# Patient Record
Sex: Female | Born: 1965 | Hispanic: No | Marital: Married | Smoking: Never smoker
Health system: Southern US, Community
[De-identification: ages and names within clinical notes are randomized; demographics above are authoritative.]

## PROBLEM LIST (undated history)

## (undated) DIAGNOSIS — E785 Hyperlipidemia, unspecified: Secondary | ICD-10-CM

## (undated) HISTORY — DX: Hyperlipidemia, unspecified: E78.5

---

## 2013-09-22 ENCOUNTER — Emergency Department (HOSPITAL_BASED_OUTPATIENT_CLINIC_OR_DEPARTMENT_OTHER): Payer: No Typology Code available for payment source

## 2013-09-22 ENCOUNTER — Emergency Department (HOSPITAL_BASED_OUTPATIENT_CLINIC_OR_DEPARTMENT_OTHER)
Admission: EM | Admit: 2013-09-22 | Discharge: 2013-09-22 | Disposition: A | Discharge status: Home / Self Care | Payer: No Typology Code available for payment source | Attending: Emergency Medicine | Admitting: Emergency Medicine

## 2013-09-22 ENCOUNTER — Encounter (HOSPITAL_BASED_OUTPATIENT_CLINIC_OR_DEPARTMENT_OTHER): Payer: Self-pay | Admitting: Emergency Medicine

## 2013-09-22 DIAGNOSIS — L02511 Cutaneous abscess of right hand: Secondary | ICD-10-CM

## 2013-09-22 DIAGNOSIS — L02519 Cutaneous abscess of unspecified hand: Secondary | ICD-10-CM | POA: Insufficient documentation

## 2013-09-22 DIAGNOSIS — L03019 Cellulitis of unspecified finger: Secondary | ICD-10-CM | POA: Diagnosis present

## 2013-09-22 DIAGNOSIS — L03011 Cellulitis of right finger: Secondary | ICD-10-CM

## 2013-09-22 LAB — BASIC METABOLIC PANEL
ANION GAP: 11 (ref 5–15)
BUN: 14 mg/dL (ref 6–23)
CALCIUM: 9.2 mg/dL (ref 8.4–10.5)
CO2: 23 mEq/L (ref 19–32)
CREATININE: 0.5 mg/dL (ref 0.50–1.10)
Chloride: 104 mEq/L (ref 96–112)
GFR calc Af Amer: >90 mL/min (ref >90)
Glucose, Bld: 111 mg/dL — ABNORMAL HIGH (ref 70–99)
Potassium: 4.1 mEq/L (ref 3.7–5.3)
SODIUM: 138 meq/L (ref 137–147)

## 2013-09-22 LAB — CBC
HCT: 36.7 % (ref 36.0–46.0)
Hemoglobin: 12.4 g/dL (ref 12.0–15.0)
MCH: 29.4 pg (ref 26.0–34.0)
MCHC: 33.8 g/dL (ref 30.0–36.0)
MCV: 87 fL (ref 78.0–100.0)
Platelets: 201 10*3/uL (ref 150–400)
RBC: 4.22 MIL/uL (ref 3.87–5.11)
RDW: 12.3 % (ref 11.5–15.5)
WBC: 7.9 10*3/uL (ref 4.0–10.5)

## 2013-09-22 MED ORDER — OXYCODONE-ACETAMINOPHEN 5-325 MG PO TABS
1.0000 | ORAL_TABLET | Freq: Once | ORAL | Status: AC
  Administered 2013-09-22: 1 via ORAL
  Filled 2013-09-22: qty 1

## 2013-09-22 MED ORDER — OXYCODONE-ACETAMINOPHEN 5-325 MG PO TABS: 1.0000 | ORAL_TABLET | Freq: Four times a day (QID) | ORAL | Status: DC | PRN

## 2013-09-22 MED ORDER — CLINDAMYCIN HCL 300 MG PO CAPS: 300.0000 mg | ORAL_CAPSULE | Freq: Four times a day (QID) | ORAL | Status: DC

## 2013-09-22 MED ORDER — CLINDAMYCIN PHOSPHATE 600 MG/50ML IV SOLN
600.0000 mg | Freq: Once | INTRAVENOUS | Status: AC
  Administered 2013-09-22: 600 mg via INTRAVENOUS
  Filled 2013-09-22: qty 50

## 2013-09-22 NOTE — ED Provider Notes (Signed)
CSN: [id number]     Arrival date & time 09/22/13  1835 History   First MD Initiated Contact with Patient 09/22/13 2051     Chief Complaint  Patient presents with  . Cellulitis     (Consider location/radiation/quality/duration/timing/severity/associated sxs/prior Treatment) HPI Comments: Patient is a 48 year old female who presents to the emergency department complaining of pain and swelling to her right thumb x3 days. Patient reports she started to have pain on the top of her thumb, and slowly noticed that it started to swell. She reports there has been redness spreading down her thumb into her hand. Pain described as pulsating. She tried taking ibuprofen with minimal relief. Denies fever or chills, vomiting, headache. No known injury or trauma.  The history is provided by the patient and a relative.    History reviewed. No pertinent past medical history. History reviewed. No pertinent past surgical history. No family history on file. History  Substance Use Topics  . Smoking status: Never Smoker   . Smokeless tobacco: Not on file  . Alcohol Use: No   OB History   Grav Para Term Preterm Abortions TAB SAB Ect Mult Living                 Review of Systems  Musculoskeletal:       + right thumb pain and swelling.  Skin: Positive for color change.  All other systems reviewed and are negative.     Allergies  Review of patient's allergies indicates no known allergies.  Home Medications   Prior to Admission medications   Medication Sig Start Date End Date Taking? Authorizing Provider  clindamycin (CLEOCIN) 300 MG capsule Take 1 capsule (300 mg total) by mouth 4 (four) times daily. X 7 days 09/22/13   Trevor Maceobyn M Albert, PA-C  oxyCODONE-acetaminophen (PERCOCET) 5-325 MG per tablet Take 1-2 tablets by mouth every 6 (six) hours as needed for severe pain. 09/22/13   Trevor Maceobyn M Albert, PA-C   BP 121/91  Pulse 66  Temp(Src) 97.9 F (36.6 C) (Oral)  Resp 18  Ht 5\' 3"  (1.6 m)  Wt 130 lb  (58.968 kg)  BMI 23.03 kg/m2  SpO2 100% Physical Exam  Nursing note and vitals reviewed. Constitutional: She is oriented to person, place, and time. She appears well-developed and well-nourished. No distress.  HENT:  Head: Normocephalic and atraumatic.  Mouth/Throat: Oropharynx is clear and moist.  Eyes: Conjunctivae and EOM are normal.  Neck: Normal range of motion. Neck supple.  Cardiovascular: Normal rate, regular rhythm and normal heart sounds.   Pulmonary/Chest: Effort normal and breath sounds normal. No respiratory distress.  Musculoskeletal:       Hands: 1 cm area of fluctuance on the dorsal aspect of right thumb over the DIP joint. No drainage. Surrounding erythema, warmth and swelling in to the thenar eminence as shown in diagram. Full R thumb and hand ROM, pain noted in thumb.  Neurological: She is alert and oriented to person, place, and time. No sensory deficit.  Skin: Skin is warm and dry.  Cap refill < 3 seconds.  Psychiatric: She has a normal mood and affect. Her behavior is normal.    ED Course  Procedures (including critical care time) INCISION AND DRAINAGE Performed by: Johnnette GourdAlbert, Quintessa Simmerman Consent: Verbal consent obtained. Risks and benefits: risks, benefits and alternatives were discussed Type: abscess  Body area: right thumb  Anesthesia: local infiltration  Incision was made with a scalpel.  Local anesthetic: lidocaine 2% without epinephrine  Anesthetic total: 1 ml  Complexity: simple  Drainage: purulent  Drainage amount: small  Packing material: none  Patient tolerance: Patient tolerated the procedure well with no immediate complications.   Labs Review Labs Reviewed  BASIC METABOLIC PANEL - Abnormal; Notable for the following:    Glucose, Bld 111 (*)    All other components within normal limits  CBC    Imaging Review Dg Hand Complete Right  09/22/2013   CLINICAL DATA:  Cellulitis.  EXAM: RIGHT HAND - COMPLETE 3+ VIEW  COMPARISON:  None.   FINDINGS: Concerning the history of thumb cellulitis, there is no radiopaque foreign body, subcutaneous gas, or evidence of acute osteomyelitis.  The tufts and mid portions of the second through fifth distal phalanges are abnormally sclerotic. No bone erosion. No evidence of erosive arthritis  No acute fracture malalignment.  Next.  IMPRESSION: 1. No acute osseous findings.  No subcutaneous gas. 2. Acrosclerosis, sometimes seen with scleroderma or other connective tissue disease. Correlate with rheumatologic history.   Electronically Signed   By: Tiburcio Pea M.D.   On: 09/22/2013 22:11     EKG Interpretation None      MDM   Final diagnoses:  Cellulitis of right thumb  Abscess of right thumb   Pt presenting with abscess and cellulitis to right thumb. She is well appearing and in no apparent distress. Afebrile, vital signs stable. Skin markings were drawn in triage. She was given a Percocet in triage with relief of her symptoms. Given cellulitis into hand, will check labs, give a dose of IV clinda in the ED and d/c home with oral antibiotics. I do not feel admission is necessary at this time. Will drain abscess. 10:51 PM Abscess drained with small amount of purulent drainage. Antibiotics given. Erythema has not spread. No leukocytosis. Vitals remain stable. Skin markings drawn. Advised f/u with [facility] or ED in 2-3 days for recheck. Strict return precautions given. Will d/c on clinda and pain medication. Return precautions given. Patient and family state understanding of treatment care plan and are agreeable.  Trevor Mace, PA-C 09/22/13 2253

## 2013-09-22 NOTE — Discharge Instructions (Signed)
Take antibiotic to completion. Take percocet for severe pain only. No driving or operating heavy machinery while taking percocet. This medication may cause drowsiness. Return to the emergency department if the redness spreads beyond the skin markings that were drawn today. Return to an urgent care Center or the emergency department for reevaluation in 2-3 days.  Cellulitis Cellulitis is an infection of the skin and the tissue beneath it. The infected area is usually red and tender. Cellulitis occurs most often in the arms and lower legs.  CAUSES  Cellulitis is caused by bacteria that enter the skin through cracks or cuts in the skin. The most common types of bacteria that cause cellulitis are staphylococci and streptococci. SIGNS AND SYMPTOMS   Redness and warmth.  Swelling.  Tenderness or pain.  Fever. DIAGNOSIS  Your health care provider can usually determine what is wrong based on a physical exam. Blood tests may also be done. TREATMENT  Treatment usually involves taking an antibiotic medicine. HOME CARE INSTRUCTIONS   Take your antibiotic medicine as directed by your health care provider. Finish the antibiotic even if you start to feel better.  Keep the infected arm or leg elevated to reduce swelling.  Apply a warm cloth to the affected area up to 4 times per day to relieve pain.  Take medicines only as directed by your health care provider.  Keep all follow-up visits as directed by your health care provider. SEEK MEDICAL CARE IF:   You notice red streaks coming from the infected area.  Your red area gets larger or turns dark in color.  Your bone or joint underneath the infected area becomes painful after the skin has healed.  Your infection returns in the same area or another area.  You notice a swollen bump in the infected area.  You develop new symptoms.  You have a fever. SEEK IMMEDIATE MEDICAL CARE IF:   You feel very sleepy.  You develop vomiting or  diarrhea.  You have a general ill feeling (malaise) with muscle aches and pains. MAKE SURE YOU:   Understand these instructions.  Will watch your condition.  Will get help right away if you are not doing well or get worse. Document Released: 11/03/2004 Document Revised: 06/10/2013 Document Reviewed: 04/11/2011 [facility] Patient Information 2015 [place], [place]. This information is not intended to replace advice given to you by your health care provider. Make sure you discuss any questions you have with your health care provider.  Abscess An abscess is an infected area that contains a collection of pus and debris.It can occur in almost any part of the body. An abscess is also known as a furuncle or boil. CAUSES  An abscess occurs when tissue gets infected. This can occur from blockage of oil or sweat glands, infection of hair follicles, or a minor injury to the skin. As the body tries to fight the infection, pus collects in the area and creates pressure under the skin. This pressure causes pain. People with weakened immune systems have difficulty fighting infections and get certain abscesses more often.  SYMPTOMS Usually an abscess develops on the skin and becomes a painful mass that is red, warm, and tender. If the abscess forms under the skin, you may feel a moveable soft area under the skin. Some abscesses break open (rupture) on their own, but most will continue to get worse without care. The infection can spread deeper into the body and eventually into the bloodstream, causing you to feel ill.  DIAGNOSIS  Your caregiver will take your medical history and perform a physical exam. A sample of fluid may also be taken from the abscess to determine what is causing your infection. TREATMENT  Your caregiver may prescribe antibiotic medicines to fight the infection. However, taking antibiotics alone usually does not cure an abscess. Your caregiver may need to make a small cut (incision) in the  abscess to drain the pus. In some cases, gauze is packed into the abscess to reduce pain and to continue draining the area. HOME CARE INSTRUCTIONS   Only take over-the-counter or prescription medicines for pain, discomfort, or fever as directed by your caregiver.  If you were prescribed antibiotics, take them as directed. Finish them even if you start to feel better.  If gauze is used, follow your caregiver's directions for changing the gauze.  To avoid spreading the infection:  Keep your draining abscess covered with a bandage.  Wash your hands well.  Do not share personal care items, towels, or whirlpools with others.  Avoid skin contact with others.  Keep your skin and clothes clean around the abscess.  Keep all follow-up appointments as directed by your caregiver. SEEK MEDICAL CARE IF:   You have increased pain, swelling, redness, fluid drainage, or bleeding.  You have muscle aches, chills, or a general ill feeling.  You have a fever. MAKE SURE YOU:   Understand these instructions.  Will watch your condition.  Will get help right away if you are not doing well or get worse. Document Released: 11/03/2004 Document Revised: 07/26/2011 Document Reviewed: 04/08/2011 [facility] Patient Information 2015 [place], [place]. This information is not intended to replace advice given to you by your health care provider. Make sure you discuss any questions you have with your health care provider.  Abscess Care After An abscess (also called a boil or furuncle) is an infected area that contains a collection of pus. Signs and symptoms of an abscess include pain, tenderness, redness, or hardness, or you may feel a moveable soft area under your skin. An abscess can occur anywhere in the body. The infection may spread to surrounding tissues causing cellulitis. A cut (incision) by the surgeon was made over your abscess and the pus was drained out. Gauze may have been packed into the space to  provide a drain that will allow the cavity to heal from the inside outwards. The boil may be painful for 5 to 7 days. Most people with a boil do not have high fevers. Your abscess, if seen early, may not have localized, and may not have been lanced. If not, another appointment may be required for this if it does not get better on its own or with medications. HOME CARE INSTRUCTIONS   Only take over-the-counter or prescription medicines for pain, discomfort, or fever as directed by your caregiver.  When you bathe, soak and then remove gauze or iodoform packs at least daily or as directed by your caregiver. You may then wash the wound gently with mild soapy water. Repack with gauze or do as your caregiver directs. SEEK IMMEDIATE MEDICAL CARE IF:   You develop increased pain, swelling, redness, drainage, or bleeding in the wound site.  You develop signs of generalized infection including muscle aches, chills, fever, or a general ill feeling.  An oral temperature above 102 F (38.9 C) develops, not controlled by medication. See your caregiver for a recheck if you develop any of the symptoms described above. If medications (antibiotics) were prescribed, take them as directed. Document  Released: 08/12/2004 Document Revised: 04/18/2011 Document Reviewed: 04/09/2007 [facility] Patient Information 2015 [place]. This information is not intended to replace advice given to you by your health care provider. Make sure you discuss any questions you have with your health care provider.

## 2013-09-22 NOTE — ED Notes (Signed)
Patient having pain and swelling to right thumb and side of hand. Reddened and warm to touch.

## 2013-09-22 NOTE — ED Provider Notes (Signed)
Medical screening examination/treatment/procedure(s) were performed by non-physician practitioner and as supervising physician I was immediately available for consultation/collaboration.   EKG Interpretation None       Jenai Scaletta, MD 09/22/13 2359 

## 2013-10-23 DIAGNOSIS — R202 Paresthesia of skin: Secondary | ICD-10-CM | POA: Insufficient documentation

## 2017-04-26 ENCOUNTER — Encounter: Payer: Self-pay | Admitting: Physician Assistant

## 2017-04-26 ENCOUNTER — Ambulatory Visit (INDEPENDENT_AMBULATORY_CARE_PROVIDER_SITE_OTHER): Payer: No Typology Code available for payment source | Admitting: Physician Assistant

## 2017-04-26 ENCOUNTER — Other Ambulatory Visit: Payer: Self-pay

## 2017-04-26 VITALS — BP 144/80 | HR 68 | Temp 97.8°F | Resp 18 | Ht 63.0 in | Wt 139.8 lb

## 2017-04-26 DIAGNOSIS — K922 Gastrointestinal hemorrhage, unspecified: Secondary | ICD-10-CM | POA: Diagnosis not present

## 2017-04-26 DIAGNOSIS — Z1211 Encounter for screening for malignant neoplasm of colon: Secondary | ICD-10-CM | POA: Diagnosis not present

## 2017-04-26 DIAGNOSIS — R1013 Epigastric pain: Secondary | ICD-10-CM | POA: Diagnosis not present

## 2017-04-26 LAB — POCT CBC
Granulocyte percent: 61.2 %G (ref 37–80)
HCT, POC: 41.1 % (ref 37.7–47.9)
HEMOGLOBIN: 13.6 g/dL (ref 12.2–16.2)
LYMPH, POC: 2.1 (ref 0.6–3.4)
MCH, POC: 28.5 pg (ref 27–31.2)
MCHC: 33.1 g/dL (ref 31.8–35.4)
MCV: 86.1 fL (ref 80–97)
MID (cbc): 0.3 (ref 0–0.9)
MPV: 8.1 fL (ref 0–99.8)
POC Granulocyte: 3.7 (ref 2–6.9)
POC LYMPH PERCENT: 33.8 %L (ref 10–50)
POC MID %: 5 %M (ref 0–12)
Platelet Count, POC: 232 10*3/uL (ref 142–424)
RBC: 4.77 M/uL (ref 4.04–5.48)
RDW, POC: 12.9 %
WBC: 6.1 10*3/uL (ref 4.6–10.2)

## 2017-04-26 LAB — POCT URINALYSIS DIP (MANUAL ENTRY)
Bilirubin, UA: NEGATIVE
Blood, UA: NEGATIVE
Glucose, UA: NEGATIVE mg/dL
Ketones, POC UA: NEGATIVE mg/dL
Leukocytes, UA: NEGATIVE
Nitrite, UA: NEGATIVE
PH UA: 6 (ref 5.0–8.0)
Protein Ur, POC: NEGATIVE mg/dL
Spec Grav, UA: 1.02 (ref 1.010–1.025)
Urobilinogen, UA: 0.2 E.U./dL

## 2017-04-26 LAB — POC HEMOCCULT BLD/STL (OFFICE/1-CARD/DIAGNOSTIC): Fecal Occult Blood, POC: POSITIVE — AB

## 2017-04-26 MED ORDER — PANTOPRAZOLE SODIUM 40 MG PO TBEC: 40.0000 mg | DELAYED_RELEASE_TABLET | Freq: Every day | ORAL | 0 refills | Status: DC

## 2017-04-26 NOTE — Progress Notes (Signed)
04/26/2017 9:15 AM   DOB: 1965-10-09 / MRN: [id number]  SUBJECTIVE:  Stephanie Farrell is a 52 y.o. female presenting for abdominal pain.  Pain is been present now for about 15 days.  Patient states that sometimes eating makes it worse sometimes it does not.  She describes the pain as a cramping and burning sensation.  She does have her gallbladder and appendix and denies a history of surgery.  She is tried ibuprofen which has not helped.  She denies difficulty swallowing.  She denies blood in the stool.  She has never had a colonoscopy nor a colon screening.  She is willing to go for that today.  She is allergic to shellfish allergy.   She  has no past medical history on file.    She  reports that  has never smoked. she has never used smokeless tobacco. She reports that she does not drink alcohol. She  has no sexual activity history on file. The patient  has no past surgical history on file.  Her family history is not on file.  Review of Systems  Constitutional: Negative for chills and fever.  Respiratory: Negative for cough and shortness of breath.   Cardiovascular: Negative for chest pain.  Gastrointestinal: Positive for abdominal pain. Negative for blood in stool, constipation, diarrhea, heartburn, melena, nausea and vomiting.  Musculoskeletal: Negative for myalgias.  Skin: Negative for itching and rash.  Neurological: Negative for dizziness and headaches.    The problem list and medications were reviewed and updated by myself where necessary and exist elsewhere in the encounter.   OBJECTIVE:  BP (!) 144/80 (BP Location: Right Arm, Patient Position: Sitting, Cuff Size: Normal)   Pulse 68   Temp 97.8 F (36.6 C) (Oral)   Resp 18   Ht 5\' 3"  (1.6 m)   Wt 139 lb 12.8 oz (63.4 kg)   SpO2 98%   BMI 24.76 kg/m   Physical Exam  Constitutional: She is oriented to person, place, and time. She appears well-developed and well-nourished. She is active.  Non-toxic appearance. No distress.    Eyes: EOM are normal. Pupils are equal, round, and reactive to light.  Cardiovascular: Normal rate, regular rhythm, S1 normal, S2 normal, normal heart sounds and intact distal pulses. Exam reveals no gallop, no friction rub and no decreased pulses.  No murmur heard. Pulmonary/Chest: Effort normal. No stridor. No tachypnea. No respiratory distress. She has no wheezes. She has no rales. She exhibits no tenderness.  Abdominal: Soft. Normal appearance and bowel sounds are normal. She exhibits no distension and no mass. There is no tenderness. There is no rigidity, no rebound, no guarding and no CVA tenderness.  Genitourinary: Rectal exam shows guaiac positive stool.  Musculoskeletal: She exhibits no edema.  Neurological: She is alert and oriented to person, place, and time. She has normal strength and normal reflexes. She is not disoriented. She displays no atrophy. No cranial nerve deficit or sensory deficit. She exhibits normal muscle tone. Coordination and gait normal.  Skin: Skin is warm and dry. No rash noted. She is not diaphoretic. No cyanosis or erythema. No pallor.  She appears to be well perfused and without pallor.  Psychiatric: Her behavior is normal.    Results for orders placed or performed in visit on 04/26/17 (from the past 72 hour(s))  POC Hemoccult Bld/Stl (1-Cd Office Dx)     Status: Abnormal   Collection Time: 04/26/17  9:06 AM  Result Value Ref Range   Card #1 Date  04-26-17    Fecal Occult Blood, POC Positive (A) Negative  POCT urinalysis dipstick     Status: None   Collection Time: 04/26/17  9:10 AM  Result Value Ref Range   Color, UA yellow yellow   Clarity, UA clear clear   Glucose, UA negative negative mg/dL   Bilirubin, UA negative negative   Ketones, POC UA negative negative mg/dL   Spec Grav, UA [id number] - 1.025   Blood, UA negative negative   pH, UA 6.0 5.0 - 8.0   Protein Ur, POC negative negative mg/dL   Urobilinogen, UA 0.2 0.2 or 1.0 E.U./dL   Nitrite,  UA Negative Negative   Leukocytes, UA Negative Negative    No results found.  ASSESSMENT AND PLAN:  Stephanie Farrell was seen today for back pain.  Diagnoses and all orders for this visit:  Abdominal pain, epigastric: Patient here with epigastric pain times 15 days.  She is tried ibuprofen which has not really helped.  Eating sometimes makes it worse.  She is got a positive fecal occult blood with no visible blood on the smear.  This is most likely upper GI, however she is [age] and needs a colon screening.  I talked with her about this and she is willing to go.  I will screen her for H. pylori today and treat as necessary.  Hopefully she will take the pantoprazole exactly as prescribed. -     Cancel: Hemoccult - 1 Card (office) -     POCT CBC -     POCT urinalysis dipstick -     CMP and Liver -     H. pylori breath test -     POC Hemoccult Bld/Stl (1-Cd Office Dx) -     pantoprazole (PROTONIX) 40 MG tablet; Take 1 tablet (40 mg total) by mouth daily. Take thirty minutes before the first meal of the day.  Gastrointestinal hemorrhage, unspecified gastrointestinal hemorrhage type: See problem 1.  Screening for colon cancer -     Ambulatory referral to Gastroenterology    The patient is advised to call or return to clinic if she does not see an improvement in symptoms, or to seek the care of the closest emergency department if she worsens with the above plan.   Deliah BostonMichael Marina Boerner, MHS, PA-C [facility] 04/26/2017 9:15 AM

## 2017-04-26 NOTE — Patient Instructions (Signed)
Take the pantoprazole exactly as prescribed. Go for your colonoscopy.  Sign up for mychart so I can release your results and prescribe medications if I need to.

## 2017-04-27 LAB — CMP AND LIVER
ALT: 21 IU/L (ref 0–32)
AST: 24 IU/L (ref 0–40)
Albumin: 4.3 g/dL (ref 3.5–5.5)
Alkaline Phosphatase: 75 IU/L (ref 39–117)
BUN: 10 mg/dL (ref 6–24)
Bilirubin Total: 0.3 mg/dL (ref 0.0–1.2)
Bilirubin, Direct: 0.1 mg/dL (ref 0.00–0.40)
CALCIUM: 9.7 mg/dL (ref 8.7–10.2)
CO2: 25 mmol/L (ref 20–29)
Chloride: 103 mmol/L (ref 96–106)
Creatinine, Ser: 0.74 mg/dL (ref 0.57–1.00)
GFR calc non Af Amer: 93 mL/min/{1.73_m2} (ref >59)
GFR, EST AFRICAN AMERICAN: 107 mL/min/{1.73_m2} (ref >59)
Glucose: 98 mg/dL (ref 65–99)
Potassium: 4.2 mmol/L (ref 3.5–5.2)
SODIUM: 142 mmol/L (ref 134–144)
TOTAL PROTEIN: 7.3 g/dL (ref 6.0–8.5)

## 2017-04-27 LAB — H. PYLORI BREATH TEST: H pylori Breath Test: POSITIVE — AB

## 2017-04-28 ENCOUNTER — Other Ambulatory Visit: Payer: Self-pay | Admitting: Physician Assistant

## 2017-04-28 MED ORDER — CLARITHROMYCIN 500 MG PO TABS: 500.0000 mg | ORAL_TABLET | Freq: Two times a day (BID) | ORAL | 0 refills | Status: DC

## 2017-04-28 MED ORDER — AMOXICILLIN 500 MG PO CAPS
1000.0000 mg | ORAL_CAPSULE | Freq: Two times a day (BID) | ORAL | 0 refills | Status: DC
Start: 2017-04-28 — End: 2017-07-26

## 2017-04-28 NOTE — Progress Notes (Signed)
Patient made aware  via telephone call.  Starting amoxicillin and Biaxin.  14 days of therapy.

## 2017-04-28 NOTE — Progress Notes (Signed)
Patient made aware of positive H. pylori lab.  This explains her epigastric pain, positive fecal occult blood, as well as the chronicity of her symptoms.  I am treating with amoxicillin and Biaxin.  Of advised that she continue taking the pantoprazole as prescribed.  Stephanie BostonMichael Tylea Hise PA-C

## 2017-05-12 ENCOUNTER — Ambulatory Visit: Payer: Self-pay | Admitting: *Deleted

## 2017-05-12 NOTE — Telephone Encounter (Signed)
Pt c/o abd "tenderness with bloating and feeling like something is stuck in her stomach". She denies nausea and vomiting but decrease in appetite. She stated that she had been prescribed some medications on March 20th for bacteria in her stomach. She was taking amoxicillin, clarithromycin, and pantoprazole. She said that she quit taking these because she felt like these are causing her symptoms. This problem started 2 days ago. She is passing gas and burping. Pt advised that she needs to be seen today. Per protocol she needs an appointment within 4 hours, but she wants to wait until tomorrow.  Advise that if she starts feeling worst, increase in symptoms to call us back, pt voiced understanding. Reason for Disposition . [1] MILD-MODERATE pain AND [2] constant AND [3] present > 2 hours  Answer Assessment - Initial Assessment Questions 1. LOCATION: "Where does it hurt?"      Upper abd both sides 2. RADIATION: "Does the pain shoot anywhere else?" (e.g., chest, back)     Whole abd 3. ONSET: "When did the pain begin?" (e.g., minutes, hours or days ago)      2 days 4. SUDDEN: "Gradual or sudden onset?"     gradual 5. PATTERN "Does the pain come and go, or is it constant?"    - If constant: "Is it getting better, staying the same, or worsening?"      (Note: Constant means the pain never goes away completely; most serious pain is constant and it progresses)     - If intermittent: "How long does it last?" "Do you have pain now?"     (Note: Intermittent means the pain goes away completely between bouts)     constant 6. SEVERITY: "How bad is the pain?"  (e.g., Scale 1-10; mild, moderate, or severe)   - MILD (1-3): doesn't interfere with normal activities, abdomen soft and not tender to touch    - MODERATE (4-7): interferes with normal activities or awakens from sleep, tender to touch    - SEVERE (8-10): excruciating pain, doubled over, unable to do any normal activities      Pain # 5 to 6 7. RECURRENT  SYMPTOM: "Have you ever had this type of abdominal pain before?" If so, ask: "When was the last time?" and "What happened that time?"      no 8. CAUSE: "What do you think is causing the abdominal pain?"     From medications that she was prescribed on March 20th 9. RELIEVING/AGGRAVATING FACTORS: "What makes it better or worse?" (e.g., movement, antacids, bowel movement)     Have not tried anything to make it better or worst 10. OTHER SYMPTOMS: "Has there been any vomiting, diarrhea, constipation, or urine problems?"       no 11. PREGNANCY: "Is there any chance you are pregnant?" "When was your last menstrual period?"       No, no periods  Protocols used: [facility]

## 2017-05-13 ENCOUNTER — Ambulatory Visit: Payer: Self-pay | Admitting: Urgent Care

## 2017-06-05 ENCOUNTER — Encounter: Payer: Self-pay | Admitting: Physician Assistant

## 2017-07-26 ENCOUNTER — Encounter: Payer: Self-pay | Admitting: Physician Assistant

## 2017-07-26 ENCOUNTER — Ambulatory Visit (INDEPENDENT_AMBULATORY_CARE_PROVIDER_SITE_OTHER): Payer: No Typology Code available for payment source | Admitting: Physician Assistant

## 2017-07-26 ENCOUNTER — Other Ambulatory Visit: Payer: Self-pay

## 2017-07-26 VITALS — BP 104/62 | HR 75 | Temp 97.9°F | Ht 63.0 in | Wt 138.8 lb

## 2017-07-26 DIAGNOSIS — K2950 Unspecified chronic gastritis without bleeding: Secondary | ICD-10-CM

## 2017-07-26 DIAGNOSIS — R1012 Left upper quadrant pain: Secondary | ICD-10-CM

## 2017-07-26 DIAGNOSIS — B9681 Helicobacter pylori [H. pylori] as the cause of diseases classified elsewhere: Secondary | ICD-10-CM

## 2017-07-26 NOTE — Patient Instructions (Addendum)
  Please go for the gastroenterology appointment to get the colonoscopy.     IF you received an x-ray today, you will [facility] an [facility]. Please contact [facility] at [phone] with questions or concerns regarding your invoice.   IF you received labwork today, you will receive an invoice from [facility]. Please contact [facility] at [phone] with questions or concerns regarding your invoice.   Our billing staff will not be able to assist you with questions regarding bills from these companies.  You will be contacted with the lab results as soon as they are available. The fastest way to get your results is to activate your My Chart account. Instructions are located on the last page of this paperwork. If you have not heard from us regarding the results in 2 weeks, please contact this office.

## 2017-07-26 NOTE — Progress Notes (Signed)
Lab Results  Component Value Date   WBC 6.1 04/26/2017   HGB 13.6 04/26/2017   HCT 41.1 04/26/2017   MCV 86.1 04/26/2017   PLT 201 09/22/2013    Lab Results  Component Value Date   CREATININE 0.74 04/26/2017   BUN 10 04/26/2017   NA 142 04/26/2017   K 4.2 04/26/2017   CL 103 04/26/2017   CO2 25 04/26/2017    Lab Results  Component Value Date   ALT 21 04/26/2017   AST 24 04/26/2017   ALKPHOS 75 04/26/2017   BILITOT 0.3 04/26/2017    No results found for: TSH  No results found for: HGBA1C  No results found for: CHOL, HDL, LDLCALC, LDLDIRECT, TRIG, CHOLHDL

## 2017-07-26 NOTE — Progress Notes (Signed)
07/26/2017 9:24 AM   DOB: 09/27/1965 / MRN: [id number]  SUBJECTIVE:  Stephanie Farrell is a 52 y.o. female presenting for recheck abdominal pain.  Last I saw patient she had some blood in her stool along with epigastric symptoms.  Her H. pylori test did come back positive.  Given the blood in the stool and her age greater than 4350 I did refer her to GI however she failed to present for that appointment.  She did just get back from [place].  Her symptoms did improve after taking antibiotics.  She denies blood in the stool at this time, diarrhea, nausea, dysphagia, odynophagia, weight loss.  She tells me she will go for her colonoscopy given that she is no longer traveling.  She is allergic to shellfish allergy.   She  has no past medical history on file.    She  reports that she has never smoked. She has never used smokeless tobacco. She reports that she does not drink alcohol. She  has no sexual activity history on file. The patient  has no past surgical history on file.  Her family history is not on file.  ROS per HPI The problem list and medications were reviewed and updated by myself where necessary and exist elsewhere in the encounter.   OBJECTIVE:  BP 104/62 (BP Location: Right Arm, Patient Position: Sitting)   Pulse 75   Temp 97.9 F (36.6 C) (Oral)   Ht 5\' 3"  (1.6 m)   Wt 138 lb 12.8 oz (63 kg)   SpO2 98%   BMI 24.59 kg/m    Wt Readings from Last 3 Encounters:  07/26/17 138 lb 12.8 oz (63 kg)  04/26/17 139 lb 12.8 oz (63.4 kg)  09/22/13 130 lb (59 kg)    Physical Exam  Constitutional: She is oriented to person, place, and time. She appears well-nourished.  Non-toxic appearance. No distress.  Eyes: Pupils are equal, round, and reactive to light. EOM are normal.  Cardiovascular: Normal rate.  Pulmonary/Chest: Effort normal.  Abdominal: Soft. Normal appearance and bowel sounds are normal. She exhibits no distension and no mass. There is no tenderness. There is no rigidity, no  rebound, no guarding and no CVA tenderness.  Neurological: She is alert and oriented to person, place, and time. She has normal strength and normal reflexes. She is not disoriented. She displays no atrophy. No cranial nerve deficit or sensory deficit. She exhibits normal muscle tone. Coordination and gait normal.  Skin: Skin is warm and dry. She is not diaphoretic. No pallor.  Psychiatric: She has a normal mood and affect. Her behavior is normal.  Vitals reviewed.   No results found for this or any previous visit (from the past 72 hour(s)).  No results found.  ASSESSMENT AND PLAN:  Hassan RowanYuhong was seen today for follow-up.  Diagnoses and all orders for this visit:  Left upper quadrant pain: She did not complete antibiotic therapy.  But it is most likely that the bacteria was partially eradicated but has now returned and she probably has some resistance to amoxicillin and Biaxin.  I will have to treat with Levaquin if her test remains positive.  Will screen for other causes however this does not seem likely. -     Vitamin D, 25-hydroxy -     CMP and Liver -     TSH -     H. pylori breath test    The patient is advised to call or return to clinic if she does  not see an improvement in symptoms, or to seek the care of the closest emergency department if she worsens with the above plan.   Deliah Boston, MHS, PA-C [facility] at [facility] 07/26/2017 9:24 AM

## 2017-07-27 ENCOUNTER — Other Ambulatory Visit: Payer: Self-pay | Admitting: Physician Assistant

## 2017-07-27 DIAGNOSIS — R1013 Epigastric pain: Secondary | ICD-10-CM

## 2017-07-27 LAB — CMP AND LIVER
ALK PHOS: 79 IU/L (ref 39–117)
ALT: 18 IU/L (ref 0–32)
AST: 21 IU/L (ref 0–40)
Albumin: 4.5 g/dL (ref 3.5–5.5)
BILIRUBIN, DIRECT: 0.12 mg/dL (ref 0.00–0.40)
BUN: 10 mg/dL (ref 6–24)
Bilirubin Total: 0.4 mg/dL (ref 0.0–1.2)
CO2: 23 mmol/L (ref 20–29)
Calcium: 9.7 mg/dL (ref 8.7–10.2)
Chloride: 103 mmol/L (ref 96–106)
Creatinine, Ser: 0.77 mg/dL (ref 0.57–1.00)
GFR calc Af Amer: 102 mL/min/{1.73_m2} (ref >59)
GFR calc non Af Amer: 88 mL/min/{1.73_m2} (ref >59)
Glucose: 86 mg/dL (ref 65–99)
Potassium: 4.4 mmol/L (ref 3.5–5.2)
SODIUM: 140 mmol/L (ref 134–144)
Total Protein: 7.4 g/dL (ref 6.0–8.5)

## 2017-07-27 LAB — TSH: TSH: 1.28 u[IU]/mL (ref 0.450–4.500)

## 2017-07-27 LAB — VITAMIN D 25 HYDROXY (VIT D DEFICIENCY, FRACTURES): VIT D 25 HYDROXY: 26 ng/mL — AB (ref 30.0–100.0)

## 2017-07-27 LAB — H. PYLORI BREATH TEST: H PYLORI BREATH TEST: POSITIVE — AB

## 2017-07-27 MED ORDER — PANTOPRAZOLE SODIUM 40 MG PO TBEC: 40.0000 mg | DELAYED_RELEASE_TABLET | Freq: Every day | ORAL | 0 refills | Status: DC

## 2017-07-28 NOTE — Addendum Note (Signed)
Addended by: Ofilia NeasLARK, Joshu Furukawa L on: 07/28/2017 02:15 PM   Modules accepted: Orders

## 2017-08-02 ENCOUNTER — Encounter: Payer: Self-pay | Admitting: Gastroenterology

## 2017-10-04 ENCOUNTER — Ambulatory Visit: Payer: No Typology Code available for payment source | Admitting: Gastroenterology

## 2017-10-07 ENCOUNTER — Other Ambulatory Visit: Payer: Self-pay

## 2017-10-07 ENCOUNTER — Emergency Department (HOSPITAL_BASED_OUTPATIENT_CLINIC_OR_DEPARTMENT_OTHER)
Admission: EM | Admit: 2017-10-07 | Discharge: 2017-10-08 | Disposition: A | Discharge status: Home / Self Care | Payer: No Typology Code available for payment source | Attending: Emergency Medicine | Admitting: Emergency Medicine

## 2017-10-07 ENCOUNTER — Emergency Department (HOSPITAL_BASED_OUTPATIENT_CLINIC_OR_DEPARTMENT_OTHER): Payer: No Typology Code available for payment source

## 2017-10-07 ENCOUNTER — Encounter (HOSPITAL_BASED_OUTPATIENT_CLINIC_OR_DEPARTMENT_OTHER): Payer: Self-pay | Admitting: Emergency Medicine

## 2017-10-07 DIAGNOSIS — R200 Anesthesia of skin: Secondary | ICD-10-CM | POA: Insufficient documentation

## 2017-10-07 DIAGNOSIS — R202 Paresthesia of skin: Secondary | ICD-10-CM

## 2017-10-07 LAB — CBC WITH DIFFERENTIAL/PLATELET
Basophils Absolute: 0 10*3/uL (ref 0.0–0.1)
Basophils Relative: 0 %
EOS ABS: 0.1 10*3/uL (ref 0.0–0.7)
Eosinophils Relative: 1 %
HCT: 39 % (ref 36.0–46.0)
Hemoglobin: 13.2 g/dL (ref 12.0–15.0)
LYMPHS PCT: 46 %
Lymphs Abs: 3.1 10*3/uL (ref 0.7–4.0)
MCH: 28.8 pg (ref 26.0–34.0)
MCHC: 33.8 g/dL (ref 30.0–36.0)
MCV: 85.2 fL (ref 78.0–100.0)
MONO ABS: 0.5 10*3/uL (ref 0.1–1.0)
MONOS PCT: 7 %
Neutro Abs: 3.2 10*3/uL (ref 1.7–7.7)
Neutrophils Relative %: 46 %
PLATELETS: 229 10*3/uL (ref 150–400)
RBC: 4.58 MIL/uL (ref 3.87–5.11)
RDW: 12.6 % (ref 11.5–15.5)
WBC: 6.8 10*3/uL (ref 4.0–10.5)

## 2017-10-07 NOTE — ED Notes (Signed)
Patient transported to CT 

## 2017-10-07 NOTE — ED Triage Notes (Signed)
PT presents with c/o left sided numbness since yesterday. No slurred speech or  facial droop or arm drift noted. No weakness noted in arms or legs.

## 2017-10-07 NOTE — ED Provider Notes (Signed)
[facility] EMERGENCY DEPARTMENT Provider Note   CSN: [id number] Arrival date & time: 10/07/17  2013     History   Chief Complaint Chief Complaint  Patient presents with  . Numbness    HPI Stephanie Farrell is a 52 y.o. female.  The history is provided by the patient and a relative. A language interpreter was used Armond Hang(mandarin [phone]).  Neurologic Problem  The current episode started yesterday. The problem occurs constantly. The problem has been gradually worsening. Associated symptoms include headaches. Pertinent negatives include no chest pain, no abdominal pain and no shortness of breath. Nothing aggravates the symptoms. Nothing relieves the symptoms.   Patient reports onset of numbness over 24 hours ago.  She reports at times it is in her left arm, sometimes her left face, and sometimes on the right side.  She also reports right-sided headache.  No focal weakness.  No visual changes.  No new medications.  She reports she has had CT scans done previously while living in [place]. She does report some left-sided shoulder pain at times  PMH-none Denies h/o CVA Patient Active Problem List   Diagnosis Date Noted  . Paresthesia 10/23/2013    History reviewed. No pertinent surgical history.   OB History   None      Home Medications    Prior to Admission medications   Medication Sig Start Date End Date Taking? Authorizing Provider  pantoprazole (PROTONIX) 40 MG tablet Take 1 tablet (40 mg total) by mouth daily. Take thirty minutes before the first meal of the day. 07/27/17   Ofilia Neaslark, Michael L, PA-C    Family History No family history on file.  Social History Social History   Tobacco Use  . Smoking status: Never Smoker  . Smokeless tobacco: Never Used  Substance Use Topics  . Alcohol use: No  . Drug use: Not on file     Allergies   Shellfish allergy   Review of Systems Review of Systems  Constitutional: Negative for fever.  Eyes: Negative for visual  disturbance.  Respiratory: Negative for shortness of breath.   Cardiovascular: Negative for chest pain.  Gastrointestinal: Negative for abdominal pain and vomiting.  Musculoskeletal: Positive for arthralgias and back pain.  Neurological: Positive for numbness and headaches. Negative for dizziness, speech difficulty and weakness.  All other systems reviewed and are negative.    Physical Exam Updated Vital Signs BP (!) 127/94 (BP Location: Left Arm)   Pulse 64   Temp 98 F (36.7 C) (Oral)   Resp 16   Ht 1.626 m (5\' 4" )   Wt 62.6 kg   SpO2 100%   BMI 23.69 kg/m   Physical Exam CONSTITUTIONAL: Well developed/well nourished HEAD: Normocephalic/atraumatic EYES: EOMI/PERRL, no nystagmus, no visual field deficit  no ptosis ENMT: Mucous membranes moist NECK: supple no meningeal signs, no bruits CV: S1/S2 noted, no murmurs/rubs/gallops noted LUNGS: Lungs are clear to auscultation bilaterally, no apparent distress ABDOMEN: soft, nontender, no rebound or guarding GU:no cva tenderness NEURO:Awake/alert, face symmetric, no arm or leg drift is noted Equal 5/5 strength with shoulder abduction, elbow flex/extension, wrist flex/extension in upper extremities and equal hand grips bilaterally Equal 5/5 strength with hip flexion,knee flex/extension, foot dorsi/plantar flexion Cranial nerves 3/4/5/6/08/15/08/11/12 tested and intact Gait normal without ataxia No past pointing Sensation to light touch intact in all extremities EXTREMITIES: pulses normalx4, full ROM SKIN: warm, color normal PSYCH: no abnormalities of mood noted   ED Treatments / Results  Labs (all labs ordered are listed, but  only abnormal results are displayed) Labs Reviewed  BASIC METABOLIC PANEL - Abnormal; Notable for the following components:      Result Value   Glucose, Bld 104 (*)    All other components within normal limits  CBC WITH DIFFERENTIAL/PLATELET    EKG None  Radiology Ct Head Wo Contrast  Result  Date: 10/08/2017 CLINICAL DATA:  LEFT-sided numbness. EXAM: CT HEAD WITHOUT CONTRAST TECHNIQUE: Contiguous axial images were obtained from the base of the skull through the vertex without intravenous contrast. COMPARISON:  None. FINDINGS: BRAIN: No intraparenchymal hemorrhage, mass effect nor midline shift. Borderline parenchymal brain volume loss. No hydrocephalus. No acute large vascular territory infarcts. No abnormal extra-axial fluid collections. Basal cisterns are patent. VASCULAR: Trace calcific atherosclerosis. SKULL/SOFT TISSUES: No skull fracture. No significant soft tissue swelling. ORBITS/SINUSES: The included ocular globes and orbital contents are normal.Trace paranasal sinus mucosal thickening. Mastoid air cells are well aerated. OTHER: None. IMPRESSION: 1. No acute intracranial process. 2. Borderline parenchymal brain volume loss. Electronically Signed   By: Awilda Metro M.D.   On: 10/08/2017 00:00    Procedures Procedures (including critical care time)  Medications Ordered in ED Medications - No data to display   Initial Impression / Assessment and Plan / ED Course  I have reviewed the triage vital signs and the nursing notes.  Pertinent labs & imaging results that were available during my care of the patient were reviewed by me and considered in my medical decision making (see chart for details).    Patient presenting with numbness for over 24 hours Her symptoms are continued inconsistent, at times she reports that her left side and sometimes on her right side.  During my exam she had no sensory deficit in her face.  There is no focal motor weakness noted.  She ambulated without difficulty, normal coordination.  History was very difficult even with Mandarin interpreter.  Therefore elected to do CT head to screen for any masses or other potential acute neurologic conditions.  CT head was negative.  I discussed this at length with the patient via interpreter [id number].   We  discussed strict ER return precautions.  She was referred to outpatient neurology.  Answered all questions from patient and her family.  Will discharge home   Final Clinical Impressions(s) / ED Diagnoses   Final diagnoses:  Paresthesia    ED Discharge Orders    None       Zadie Rhine, MD 10/08/17 [phone]

## 2017-10-08 LAB — BASIC METABOLIC PANEL
ANION GAP: 10 (ref 5–15)
BUN: 13 mg/dL (ref 6–20)
CALCIUM: 9.3 mg/dL (ref 8.9–10.3)
CO2: 25 mmol/L (ref 22–32)
CREATININE: 0.61 mg/dL (ref 0.44–1.00)
Chloride: 106 mmol/L (ref 98–111)
GFR calc non Af Amer: >60 mL/min (ref >60)
Glucose, Bld: 104 mg/dL — ABNORMAL HIGH (ref 70–99)
Potassium: 4.1 mmol/L (ref 3.5–5.1)
Sodium: 141 mmol/L (ref 135–145)

## 2017-10-08 NOTE — Discharge Instructions (Signed)

## 2017-11-16 ENCOUNTER — Ambulatory Visit (INDEPENDENT_AMBULATORY_CARE_PROVIDER_SITE_OTHER): Payer: No Typology Code available for payment source | Admitting: Gastroenterology

## 2017-11-16 ENCOUNTER — Encounter: Payer: Self-pay | Admitting: Gastroenterology

## 2017-11-16 VITALS — BP 104/70 | HR 80 | Ht 62.0 in | Wt 138.1 lb

## 2017-11-16 DIAGNOSIS — K648 Other hemorrhoids: Secondary | ICD-10-CM

## 2017-11-16 DIAGNOSIS — R195 Other fecal abnormalities: Secondary | ICD-10-CM

## 2017-11-16 DIAGNOSIS — R1011 Right upper quadrant pain: Secondary | ICD-10-CM | POA: Diagnosis not present

## 2017-11-16 DIAGNOSIS — A048 Other specified bacterial intestinal infections: Secondary | ICD-10-CM | POA: Diagnosis not present

## 2017-11-16 DIAGNOSIS — K5909 Other constipation: Secondary | ICD-10-CM

## 2017-11-16 MED ORDER — METRONIDAZOLE 250 MG PO TABS: 250.0000 mg | ORAL_TABLET | Freq: Four times a day (QID) | ORAL | 0 refills | Status: DC

## 2017-11-16 MED ORDER — DOXYCYCLINE HYCLATE 100 MG PO CAPS: 100.0000 mg | ORAL_CAPSULE | Freq: Two times a day (BID) | ORAL | 0 refills | Status: DC

## 2017-11-16 MED ORDER — NA SULFATE-K SULFATE-MG SULF 17.5-3.13-1.6 GM/177ML PO SOLN: ORAL | 0 refills | Status: DC

## 2017-11-16 MED ORDER — OMEPRAZOLE 40 MG PO CPDR: 40.0000 mg | DELAYED_RELEASE_CAPSULE | Freq: Two times a day (BID) | ORAL | 0 refills | Status: DC

## 2017-11-16 MED ORDER — BISMUTH SUBSALICYLATE 262 MG PO CHEW: CHEWABLE_TABLET | ORAL | 0 refills | Status: DC

## 2017-11-16 NOTE — Patient Instructions (Addendum)
  We will send in the following prescriptions for you:   Bismuth 524mg  1 tablet four times dailyX 14 days Flagyl 250mg  1 tablet four times daily X 14 days Doxycycline 100mg  1 capsule Twice daily X 14 days Omeprazole 40mg  twice daily X 14 days   You have been scheduled for an endoscopy and colonoscopy. Please follow the written instructions given to you at your visit today. Please pick up your prep supplies at the pharmacy within the next 1-3 days. If you use inhalers (even only as needed), please bring them with you on the day of your procedure. Your physician has requested that you go to www.startemmi.com and enter the access code given to you at your visit today. This web site gives a general overview about your procedure. However, you should still follow specific instructions given to you by our office regarding your preparation for the procedure.   Thank you for choosing [facility]  Philbert Riser Gabriana Wilmott,MD
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
## 2017-12-12 ENCOUNTER — Encounter: Payer: No Typology Code available for payment source | Admitting: Gastroenterology

## 2018-07-15 ENCOUNTER — Other Ambulatory Visit: Payer: Self-pay | Admitting: Gastroenterology

## 2019-02-16 IMAGING — CT CT HEAD W/O CM
3 series · 15 of 47 positions shown, 18 images · non-contrast
Comparison: None.

CLINICAL DATA: LEFT-sided numbness.

EXAM:
CT HEAD WITHOUT CONTRAST
TECHNIQUE: Contiguous axial images were obtained from the base of the skull
through the vertex without intravenous contrast.

[Series 2: head wo · axial · 0.45mm/px · z∈[+642,+767]mm · 9 of 31 slices shown, 12 images]
[im 3/31  brain]
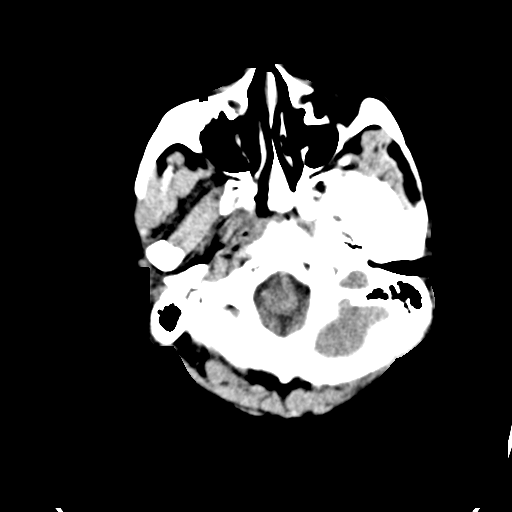
[im 3/31  bone]
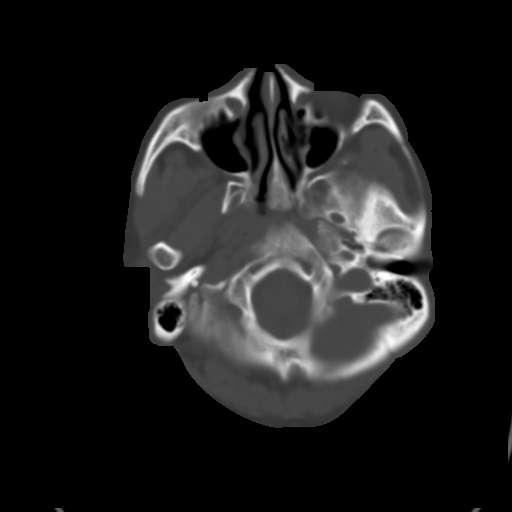
[im 6/31  brain]
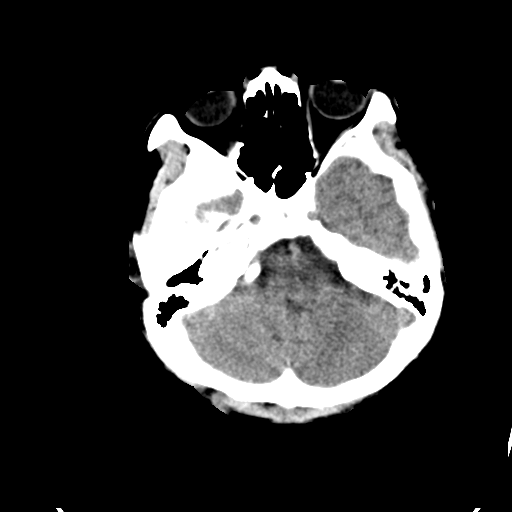
[im 9/31  brain]
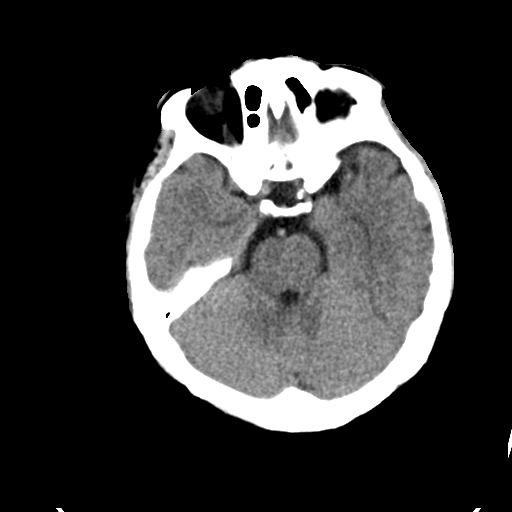
[im 12/31  brain]
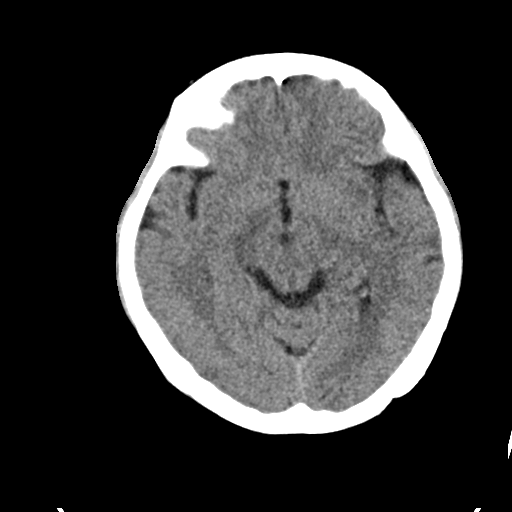
[im 16/31  brain]
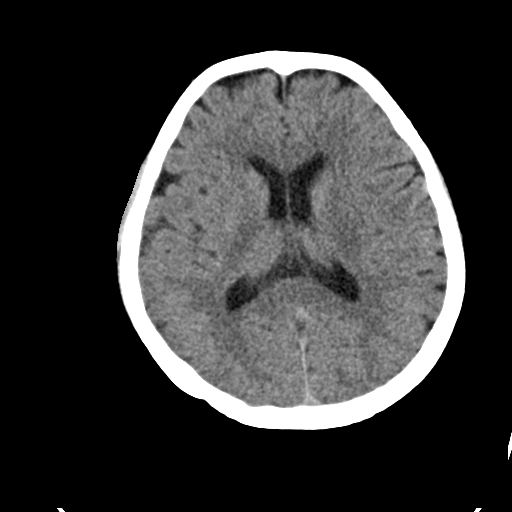
[im 16/31  bone]
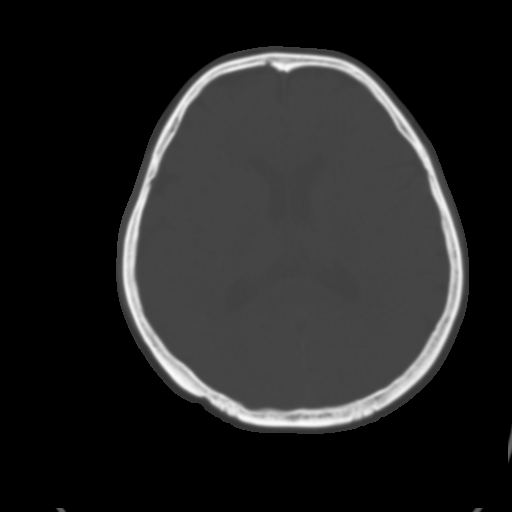
[im 19/31  brain]
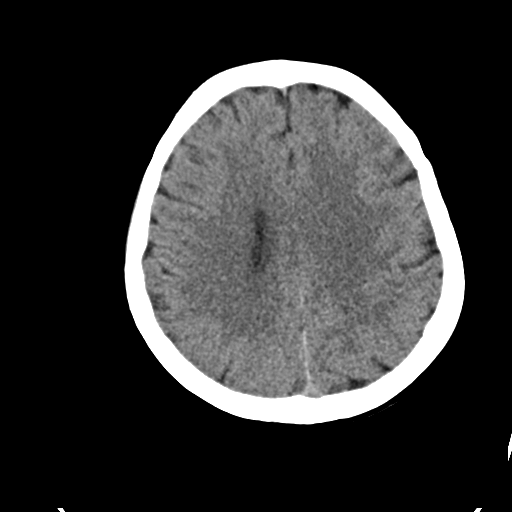
[im 22/31  brain]
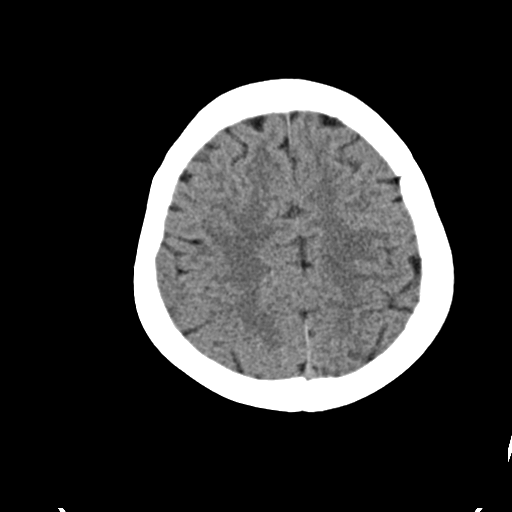
[im 25/31  brain]
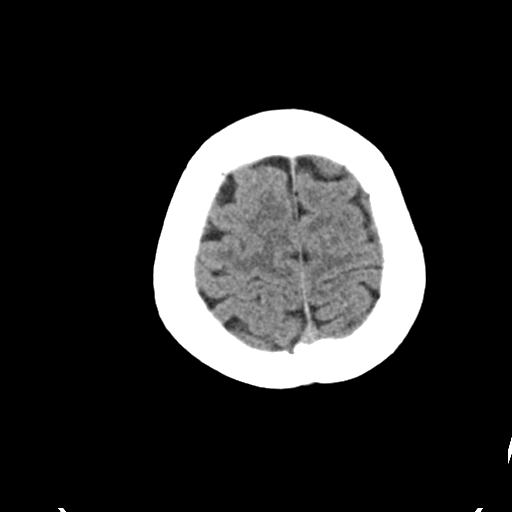
[im 28/31  brain]
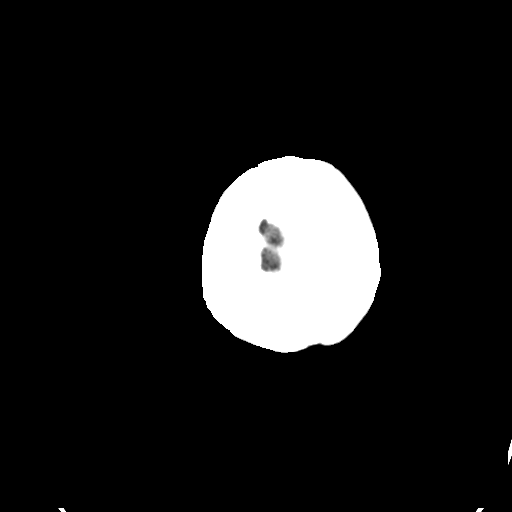
[im 28/31  bone]
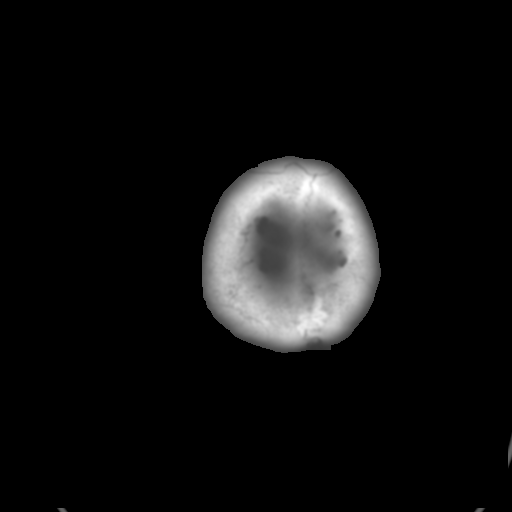

[Series 4: coronal soft · coronal · 0.33mm/px · 3 of 67 slices shown]
[im 23/67  brain]
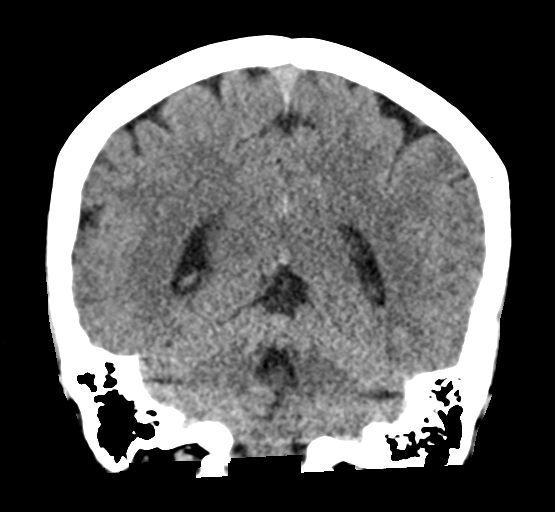
[im 30/67  brain]
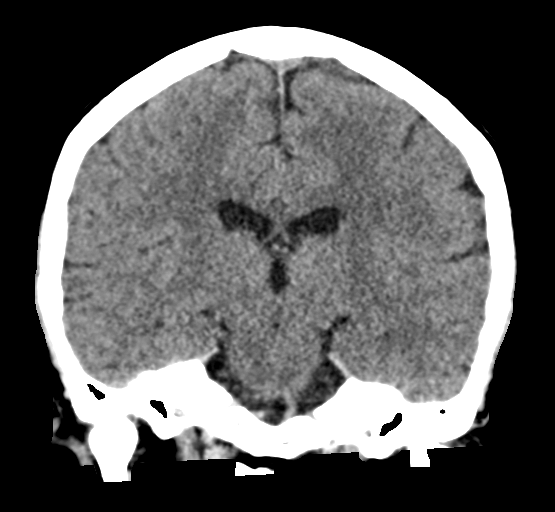
[im 37/67  brain]
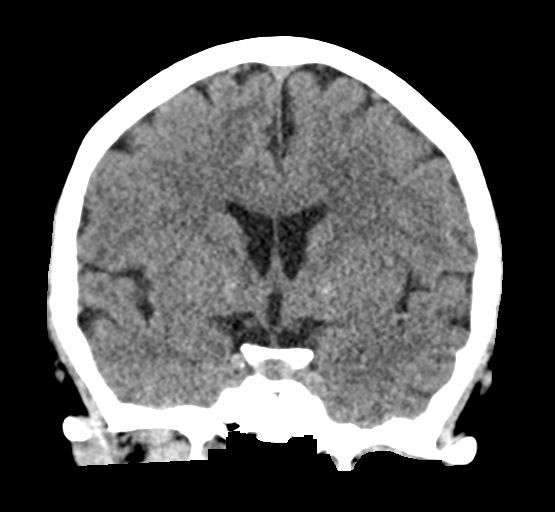

[Series 5: sag soft · sagittal · 0.30mm/px · 3 of 62 slices shown]
[im 21/62  brain]
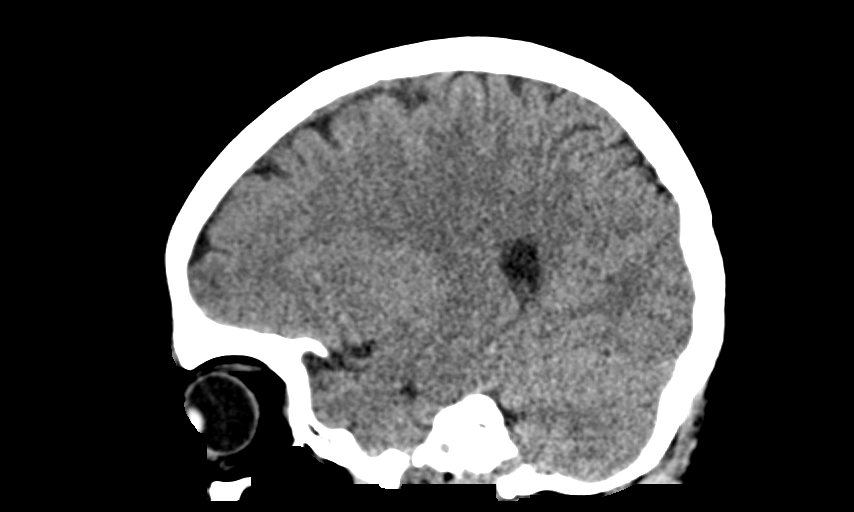
[im 31/62  brain]
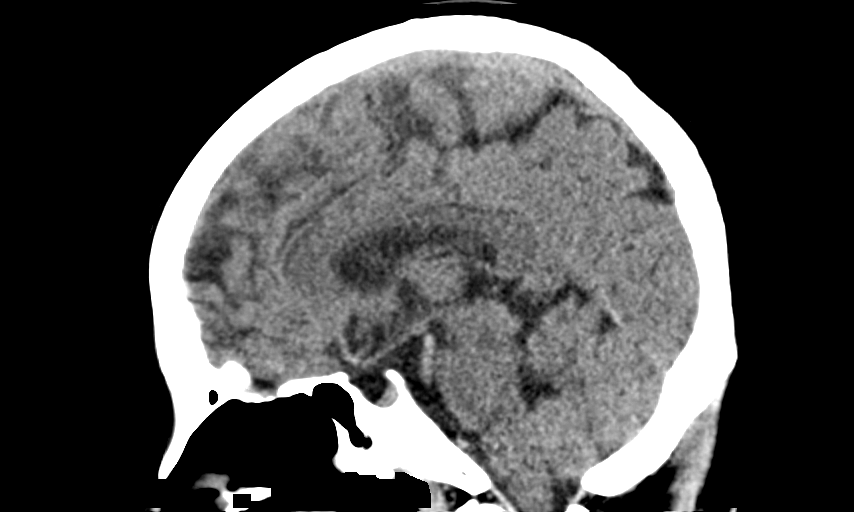
[im 41/62  brain]
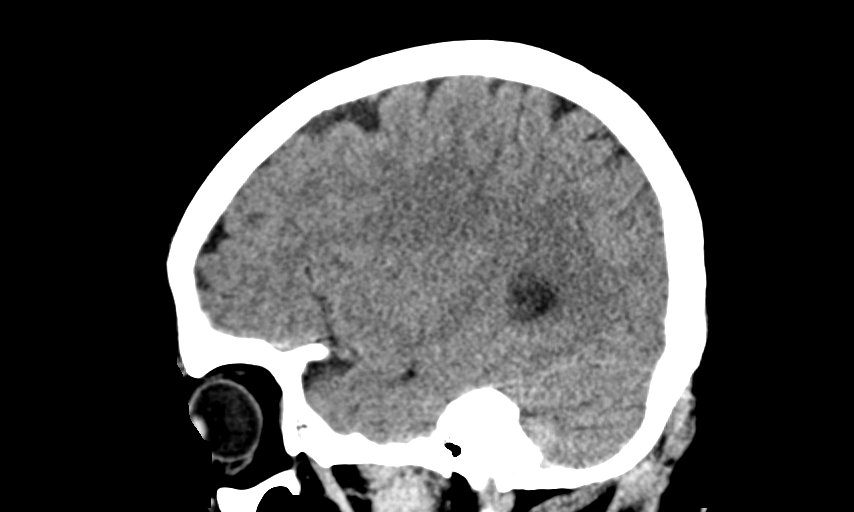

[15 of 47 positions shown; findings below may reference images not displayed]

FINDINGS: BRAIN: No intraparenchymal hemorrhage, mass effect nor midline
shift. Borderline parenchymal brain volume loss. No hydrocephalus.
No acute large vascular territory infarcts. No abnormal extra-axial
fluid collections. Basal cisterns are patent.

VASCULAR: Trace calcific atherosclerosis.

SKULL/SOFT TISSUES: No skull fracture. No significant soft tissue
swelling.

ORBITS/SINUSES: The included ocular globes and orbital contents are
normal.Trace paranasal sinus mucosal thickening. Mastoid air cells
are well aerated.

OTHER: None.
IMPRESSION: 1. No acute intracranial process.
2. Borderline parenchymal brain volume loss.

## 2019-08-08 ENCOUNTER — Other Ambulatory Visit (HOSPITAL_BASED_OUTPATIENT_CLINIC_OR_DEPARTMENT_OTHER): Payer: Self-pay | Admitting: Physician Assistant

## 2019-08-08 DIAGNOSIS — Z1231 Encounter for screening mammogram for malignant neoplasm of breast: Secondary | ICD-10-CM

## 2019-09-11 LAB — HM COLONOSCOPY

## 2020-06-24 ENCOUNTER — Other Ambulatory Visit: Payer: Self-pay

## 2020-06-24 ENCOUNTER — Ambulatory Visit (INDEPENDENT_AMBULATORY_CARE_PROVIDER_SITE_OTHER): Payer: 59 | Admitting: Internal Medicine

## 2020-06-24 ENCOUNTER — Encounter: Payer: Self-pay | Admitting: Internal Medicine

## 2020-06-24 VITALS — BP 116/76 | HR 73 | Temp 98.2°F | Ht 62.0 in | Wt 144.0 lb

## 2020-06-24 DIAGNOSIS — Z124 Encounter for screening for malignant neoplasm of cervix: Secondary | ICD-10-CM

## 2020-06-24 DIAGNOSIS — R202 Paresthesia of skin: Secondary | ICD-10-CM

## 2020-06-24 DIAGNOSIS — Z0001 Encounter for general adult medical examination with abnormal findings: Secondary | ICD-10-CM

## 2020-06-24 DIAGNOSIS — R200 Anesthesia of skin: Secondary | ICD-10-CM | POA: Insufficient documentation

## 2020-06-24 DIAGNOSIS — Z1231 Encounter for screening mammogram for malignant neoplasm of breast: Secondary | ICD-10-CM

## 2020-06-24 LAB — BASIC METABOLIC PANEL
BUN: 22 mg/dL (ref 6–23)
CO2: 25 mEq/L (ref 19–32)
Calcium: 9.2 mg/dL (ref 8.4–10.5)
Chloride: 103 mEq/L (ref 96–112)
Creatinine, Ser: 0.73 mg/dL (ref 0.40–1.20)
GFR: 91.97 mL/min (ref >60.00)
Glucose, Bld: 92 mg/dL (ref 70–99)
Potassium: 3.7 mEq/L (ref 3.5–5.1)
Sodium: 137 mEq/L (ref 135–145)

## 2020-06-24 LAB — CBC WITH DIFFERENTIAL/PLATELET
Basophils Absolute: 0 10*3/uL (ref 0.0–0.1)
Basophils Relative: 0.5 % (ref 0.0–3.0)
Eosinophils Absolute: 0 10*3/uL (ref 0.0–0.7)
Eosinophils Relative: 0.6 % (ref 0.0–5.0)
HCT: 39.7 % (ref 36.0–46.0)
Hemoglobin: 13.4 g/dL (ref 12.0–15.0)
Lymphocytes Relative: 29.1 % (ref 12.0–46.0)
Lymphs Abs: 2 10*3/uL (ref 0.7–4.0)
MCHC: 33.7 g/dL (ref 30.0–36.0)
MCV: 85.9 fl (ref 78.0–100.0)
Monocytes Absolute: 0.4 10*3/uL (ref 0.1–1.0)
Monocytes Relative: 6.5 % (ref 3.0–12.0)
Neutro Abs: 4.3 10*3/uL (ref 1.4–7.7)
Neutrophils Relative %: 63.3 % (ref 43.0–77.0)
Platelets: 238 10*3/uL (ref 150.0–400.0)
RBC: 4.62 Mil/uL (ref 3.87–5.11)
RDW: 12.7 % (ref 11.5–15.5)
WBC: 6.8 10*3/uL (ref 4.0–10.5)

## 2020-06-24 LAB — HEPATIC FUNCTION PANEL
ALT: 20 U/L (ref 0–35)
AST: 22 U/L (ref 0–37)
Albumin: 4.3 g/dL (ref 3.5–5.2)
Alkaline Phosphatase: 72 U/L (ref 39–117)
Bilirubin, Direct: 0 mg/dL (ref 0.0–0.3)
Total Bilirubin: 0.3 mg/dL (ref 0.2–1.2)
Total Protein: 7.4 g/dL (ref 6.0–8.3)

## 2020-06-24 LAB — HEMOGLOBIN A1C: Hgb A1c MFr Bld: 5.7 % (ref 4.6–6.5)

## 2020-06-24 LAB — LIPID PANEL
Cholesterol: 232 mg/dL — ABNORMAL HIGH (ref 0–200)
HDL: 66.2 mg/dL (ref >39.00)
NonHDL: 165.89
Total CHOL/HDL Ratio: 4
Triglycerides: 360 mg/dL — ABNORMAL HIGH (ref 0.0–149.0)
VLDL: 72 mg/dL — ABNORMAL HIGH (ref 0.0–40.0)

## 2020-06-24 LAB — LDL CHOLESTEROL, DIRECT: Direct LDL: 127 mg/dL

## 2020-06-24 LAB — FOLATE: Folate: >24.4 ng/mL (ref >5.9)

## 2020-06-24 LAB — VITAMIN B12: Vitamin B-12: 790 pg/mL (ref 211–911)

## 2020-06-24 NOTE — Progress Notes (Signed)
Subjective:  Patient ID: Stephanie Farrell, female    DOB: 06-27-1965  Age: 55 y.o. MRN: [id number]  CC: Annual Exam  This visit occurred during the SARS-CoV-2 public health emergency.  Safety protocols were in place, including screening questions prior to the visit, additional usage of staff PPE, and extensive cleaning of exam room while observing appropriate contact time as indicated for disinfecting solutions.   [place] female who speaks Mandarin.  Her daughter translates for her.  HPI Stephanie Farrell presents for a CPX, f/up, and to establish.  She complains of a several year history of numbness and tingling.  It is symmetrical and more prominent in the lower extremities than the upper extremities.  She also complains of pain in her large joints.  She has seen orthopedics and has had x-rays done.  She is getting symptom relief with an NSAID.  History Stephanie Farrell has a past medical history of Hyperlipidemia.   She has no past surgical history on file.   Her family history is not on file.She reports that she has never smoked. She has never used smokeless tobacco. She reports that she does not drink alcohol and does not use drugs.  Outpatient Medications Prior to Visit  Medication Sig Dispense Refill  . bismuth subsalicylate (PEPTO BISMOL) 262 MG chewable tablet 1 chew four times a day for 14 days 56 tablet 0  . doxycycline (VIBRAMYCIN) 100 MG capsule Take 1 capsule (100 mg total) by mouth 2 (two) times daily. X 14 days 28 capsule 0  . ibuprofen (ADVIL,MOTRIN) 100 MG tablet Take 100 mg by mouth as needed for fever.    . metroNIDAZOLE (FLAGYL) 250 MG tablet Take 1 tablet (250 mg total) by mouth 4 (four) times daily. X 14 days 56 tablet 0  . Na Sulfate-K Sulfate-Mg Sulf 17.5-3.13-1.6 GM/177ML SOLN 1 prep kit 354 mL 0  . omeprazole (PRILOSEC) 40 MG capsule Take 1 capsule (40 mg total) by mouth 2 (two) times daily. X 14 days 56 capsule 0   No facility-administered medications prior to visit.     ROS Review of Systems  Constitutional: Negative.  Negative for appetite change, diaphoresis, fatigue and unexpected weight change.  HENT: Negative.   Eyes: Negative for visual disturbance.  Respiratory: Negative.  Negative for cough, chest tightness and shortness of breath.   Cardiovascular: Negative for chest pain, palpitations and leg swelling.  Gastrointestinal: Negative for abdominal pain, constipation, diarrhea, nausea and vomiting.  Endocrine: Negative.   Genitourinary: Negative.  Negative for difficulty urinating.  Musculoskeletal: Positive for arthralgias and neck pain. Negative for back pain and myalgias.  Skin: Negative.  Negative for color change, pallor and rash.  Neurological: Positive for numbness. Negative for dizziness, seizures, speech difficulty, weakness, light-headedness and headaches.  Hematological: Negative for adenopathy. Does not bruise/bleed easily.  Psychiatric/Behavioral: Negative.     Objective:  BP 116/76 (BP Location: Right Arm, Patient Position: Sitting, Cuff Size: Large)   Pulse 73   Temp 98.2 F (36.8 C) (Oral)   Ht 5' 2" (1.575 m)   Wt 144 lb (65.3 kg)   SpO2 96%   BMI 26.34 kg/m   Physical Exam Vitals reviewed.  Constitutional:      Appearance: Normal appearance.  HENT:     Nose: Nose normal.     Mouth/Throat:     Mouth: Mucous membranes are moist.  Eyes:     General: No scleral icterus.    Conjunctiva/sclera: Conjunctivae normal.  Cardiovascular:     Rate and Rhythm: Normal  rate and regular rhythm.     Heart sounds: No murmur heard.   Pulmonary:     Effort: Pulmonary effort is normal.     Breath sounds: No stridor. No wheezing, rhonchi or rales.  Abdominal:     General: Abdomen is flat.     Palpations: There is no mass.     Tenderness: There is no abdominal tenderness. There is no guarding.     Hernia: No hernia is present.  Musculoskeletal:        General: Normal range of motion.     Cervical back: Neck supple.      Right lower leg: No edema.     Left lower leg: No edema.  Lymphadenopathy:     Cervical: No cervical adenopathy.  Skin:    General: Skin is warm and dry.  Neurological:     General: No focal deficit present.     Mental Status: She is alert and oriented to person, place, and time. Mental status is at baseline.     Cranial Nerves: Cranial nerves are intact.     Sensory: Sensation is intact.     Motor: Motor function is intact. No weakness.     Coordination: Coordination is intact.     Deep Tendon Reflexes: Reflexes normal. Babinski sign absent on the right side. Babinski sign absent on the left side.     Reflex Scores:      Tricep reflexes are 0 on the right side and 0 on the left side.      Bicep reflexes are 0 on the right side and 0 on the left side.      Brachioradialis reflexes are 0 on the right side and 0 on the left side.      Patellar reflexes are 0 on the right side and 0 on the left side.      Achilles reflexes are 0 on the right side and 0 on the left side. Psychiatric:        Mood and Affect: Mood normal.        Behavior: Behavior normal.     Lab Results  Component Value Date   WBC 6.8 06/24/2020   HGB 13.4 06/24/2020   HCT 39.7 06/24/2020   PLT 238.0 06/24/2020   GLUCOSE 92 06/24/2020   CHOL 232 (H) 06/24/2020   TRIG 360.0 (H) 06/24/2020   HDL 66.20 06/24/2020   LDLDIRECT 127.0 06/24/2020   ALT 20 06/24/2020   AST 22 06/24/2020   NA 137 06/24/2020   K 3.7 06/24/2020   CL 103 06/24/2020   CREATININE 0.73 06/24/2020   BUN 22 06/24/2020   CO2 25 06/24/2020   TSH 1.280 07/26/2017   HGBA1C 5.7 06/24/2020    Assessment & Plan:   Stephanie Farrell was seen today for annual exam.  Diagnoses and all orders for this visit:  Encounter for general adult medical examination with abnormal findings- Exam completed, labs reviewed, vaccines reviewed - She refused a Tdap, cancer screenings addressed, patient education was given. -     Lipid panel; Future -     HIV Antibody  (routine testing w rflx); Future -     Hepatitis C antibody; Future -     Hepatitis C antibody -     HIV Antibody (routine testing w rflx) -     Lipid panel  Bilateral numbness and tingling of arms and legs- Labs do not reveal any secondary causes.  I recommended that she see neurology as she may benefit from  undergoing an electrical study to evaluate for neuropthy. -     CBC with Differential/Platelet; Future -     Vitamin B12; Future -     Hemoglobin A1c; Future -     Hepatic function panel; Future -     Folate; Future -     Thyroid Panel With TSH; Future -     Basic metabolic panel; Future -     Vitamin B1; Future -     Vitamin B1 -     Basic metabolic panel -     Thyroid Panel With TSH -     Folate -     Hepatic function panel -     Hemoglobin A1c -     Vitamin B12 -     CBC with Differential/Platelet -     Ambulatory referral to Neurology  Visit for screening mammogram -     MM DIGITAL SCREENING BILATERAL; Future  Screening for cervical cancer -     Ambulatory referral to Gynecology  Other orders -     LDL cholesterol, direct   I have discontinued Stephanie Farrell's ibuprofen, Na Sulfate-K Sulfate-Mg Sulf, bismuth subsalicylate, metroNIDAZOLE, doxycycline, and omeprazole.  No orders of the defined types were placed in this encounter.    Follow-up: Return in about 3 months (around 09/24/2020).  Stephanie Calico, MD

## 2020-06-24 NOTE — Patient Instructions (Signed)
Health Maintenance, Female Adopting a healthy lifestyle and getting preventive care are important in promoting health and wellness. Ask your health care provider about:  The right schedule for you to have regular tests and exams.  Things you can do on your own to prevent diseases and keep yourself healthy. What should I know about diet, weight, and exercise? Eat a healthy diet  Eat a diet that includes plenty of vegetables, fruits, low-fat dairy products, and lean protein.  Do not eat a lot of foods that are high in solid fats, added sugars, or sodium.   Maintain a healthy weight Body mass index (BMI) is used to identify weight problems. It estimates body fat based on height and weight. Your health care provider can help determine your BMI and help you achieve or maintain a healthy weight. Get regular exercise Get regular exercise. This is one of the most important things you can do for your health. Most adults should:  Exercise for at least 150 minutes each week. The exercise should increase your heart rate and make you sweat (moderate-intensity exercise).  Do strengthening exercises at least twice a week. This is in addition to the moderate-intensity exercise.  Spend less time sitting. Even light physical activity can be beneficial. Watch cholesterol and blood lipids Have your blood tested for lipids and cholesterol at 56 years of age, then have this test every 5 years. Have your cholesterol levels checked more often if:  Your lipid or cholesterol levels are high.  You are older than 56 years of age.  You are at high risk for heart disease. What should I know about cancer screening? Depending on your health history and family history, you may need to have cancer screening at various ages. This may include screening for:  Breast cancer.  Cervical cancer.  Colorectal cancer.  Skin cancer.  Lung cancer. What should I know about heart disease, diabetes, and high blood  pressure? Blood pressure and heart disease  High blood pressure causes heart disease and increases the risk of stroke. This is more likely to develop in people who have high blood pressure readings, are of African descent, or are overweight.  Have your blood pressure checked: ? Every 3-5 years if you are 18-39 years of age. ? Every year if you are 40 years old or older. Diabetes Have regular diabetes screenings. This checks your fasting blood sugar level. Have the screening done:  Once every three years after age 40 if you are at a normal weight and have a low risk for diabetes.  More often and at a younger age if you are overweight or have a high risk for diabetes. What should I know about preventing infection? Hepatitis B If you have a higher risk for hepatitis B, you should be screened for this virus. Talk with your health care provider to find out if you are at risk for hepatitis B infection. Hepatitis C Testing is recommended for:  Everyone born from 1945 through 1965.  Anyone with known risk factors for hepatitis C. Sexually transmitted infections (STIs)  Get screened for STIs, including gonorrhea and chlamydia, if: ? You are sexually active and are younger than 56 years of age. ? You are older than 56 years of age and your health care provider tells you that you are at risk for this type of infection. ? Your sexual activity has changed since you were last screened, and you are at increased risk for chlamydia or gonorrhea. Ask your health care provider   if you are at risk.  Ask your health care provider about whether you are at high risk for HIV. Your health care provider may recommend a prescription medicine to help prevent HIV infection. If you choose to take medicine to prevent HIV, you should first get tested for HIV. You should then be tested every 3 months for as long as you are taking the medicine. Pregnancy  If you are about to stop having your period (premenopausal) and  you may become pregnant, seek counseling before you get pregnant.  Take 400 to 800 micrograms (mcg) of folic acid every day if you become pregnant.  Ask for birth control (contraception) if you want to prevent pregnancy. Osteoporosis and menopause Osteoporosis is a disease in which the bones lose minerals and strength with aging. This can result in bone fractures. If you are 65 years old or older, or if you are at risk for osteoporosis and fractures, ask your health care provider if you should:  Be screened for bone loss.  Take a calcium or vitamin D supplement to lower your risk of fractures.  Be given hormone replacement therapy (HRT) to treat symptoms of menopause. Follow these instructions at home: Lifestyle  Do not use any products that contain nicotine or tobacco, such as cigarettes, e-cigarettes, and chewing tobacco. If you need help quitting, ask your health care provider.  Do not use street drugs.  Do not share needles.  Ask your health care provider for help if you need support or information about quitting drugs. Alcohol use  Do not drink alcohol if: ? Your health care provider tells you not to drink. ? You are pregnant, may be pregnant, or are planning to become pregnant.  If you drink alcohol: ? Limit how much you use to 0-1 drink a day. ? Limit intake if you are breastfeeding.  Be aware of how much alcohol is in your drink. In the [place] one drink equals one 12 oz bottle of beer (355 mL), one 5 oz glass of wine (148 mL), or one 1 oz glass of hard liquor (44 mL). General instructions  Schedule regular health, dental, and eye exams.  Stay current with your vaccines.  Tell your health care provider if: ? You often feel depressed. ? You have ever been abused or do not feel safe at home. Summary  Adopting a healthy lifestyle and getting preventive care are important in promoting health and wellness.  Follow your health care provider's instructions about healthy  diet, exercising, and getting tested or screened for diseases.  Follow your health care provider's instructions on monitoring your cholesterol and blood pressure. This information is not intended to replace advice given to you by your health care provider. Make sure you discuss any questions you have with your health care provider. Document Revised: 01/17/2018 Document Reviewed: 01/17/2018 Elsevier Patient Education  2021 [employer] Inc[employer]  

## 2020-06-25 DIAGNOSIS — Z124 Encounter for screening for malignant neoplasm of cervix: Secondary | ICD-10-CM | POA: Insufficient documentation

## 2020-07-01 LAB — VITAMIN B1: Vitamin B1 (Thiamine): 19 nmol/L (ref 8–30)

## 2020-07-01 LAB — THYROID PANEL WITH TSH
Free Thyroxine Index: 2.4 (ref 1.4–3.8)
T3 Uptake: 32 % (ref 22–35)
T4, Total: 7.4 ug/dL (ref 5.1–11.9)
TSH: 0.68 mIU/L (ref 0.40–4.50)

## 2020-07-01 LAB — HEPATITIS C ANTIBODY
Hepatitis C Ab: NONREACTIVE
SIGNAL TO CUT-OFF: 0 (ref <1.00)

## 2020-07-01 LAB — HIV ANTIBODY (ROUTINE TESTING W REFLEX): HIV 1&2 Ab, 4th Generation: NONREACTIVE

## 2020-12-28 ENCOUNTER — Encounter: Payer: Self-pay | Admitting: Internal Medicine

## 2020-12-28 ENCOUNTER — Other Ambulatory Visit: Payer: Self-pay

## 2020-12-28 ENCOUNTER — Ambulatory Visit (INDEPENDENT_AMBULATORY_CARE_PROVIDER_SITE_OTHER): Payer: 59 | Admitting: Internal Medicine

## 2020-12-28 VITALS — BP 121/80 | HR 61 | Temp 98.2°F | Wt 137.2 lb

## 2020-12-28 DIAGNOSIS — E781 Pure hyperglyceridemia: Secondary | ICD-10-CM

## 2020-12-28 DIAGNOSIS — Z1231 Encounter for screening mammogram for malignant neoplasm of breast: Secondary | ICD-10-CM

## 2020-12-28 DIAGNOSIS — R10822 Left upper quadrant rebound abdominal tenderness: Secondary | ICD-10-CM

## 2020-12-28 LAB — URINALYSIS, ROUTINE W REFLEX MICROSCOPIC
Bilirubin Urine: NEGATIVE
Hgb urine dipstick: NEGATIVE
Ketones, ur: NEGATIVE
Leukocytes,Ua: NEGATIVE
Nitrite: NEGATIVE
RBC / HPF: NONE SEEN (ref 0–?)
Specific Gravity, Urine: 1.015 (ref 1.000–1.030)
Total Protein, Urine: NEGATIVE
Urine Glucose: NEGATIVE
Urobilinogen, UA: 0.2 (ref 0.0–1.0)
WBC, UA: NONE SEEN (ref 0–?)
pH: 7.5 (ref 5.0–8.0)

## 2020-12-28 LAB — BASIC METABOLIC PANEL
BUN: 14 mg/dL (ref 6–23)
CO2: 32 mEq/L (ref 19–32)
Calcium: 9.4 mg/dL (ref 8.4–10.5)
Chloride: 102 mEq/L (ref 96–112)
Creatinine, Ser: 0.8 mg/dL (ref 0.40–1.20)
GFR: 82.1 mL/min (ref >60.00)
Glucose, Bld: 95 mg/dL (ref 70–99)
Potassium: 4.2 mEq/L (ref 3.5–5.1)
Sodium: 138 mEq/L (ref 135–145)

## 2020-12-28 LAB — CBC WITH DIFFERENTIAL/PLATELET
Basophils Absolute: 0 10*3/uL (ref 0.0–0.1)
Basophils Relative: 0.3 % (ref 0.0–3.0)
Eosinophils Absolute: 0 10*3/uL (ref 0.0–0.7)
Eosinophils Relative: 0.6 % (ref 0.0–5.0)
HCT: 39.9 % (ref 36.0–46.0)
Hemoglobin: 13.3 g/dL (ref 12.0–15.0)
Lymphocytes Relative: 32.4 % (ref 12.0–46.0)
Lymphs Abs: 1.8 10*3/uL (ref 0.7–4.0)
MCHC: 33.3 g/dL (ref 30.0–36.0)
MCV: 86.4 fl (ref 78.0–100.0)
Monocytes Absolute: 0.3 10*3/uL (ref 0.1–1.0)
Monocytes Relative: 5.9 % (ref 3.0–12.0)
Neutro Abs: 3.3 10*3/uL (ref 1.4–7.7)
Neutrophils Relative %: 60.8 % (ref 43.0–77.0)
Platelets: 223 10*3/uL (ref 150.0–400.0)
RBC: 4.61 Mil/uL (ref 3.87–5.11)
RDW: 13.3 % (ref 11.5–15.5)
WBC: 5.4 10*3/uL (ref 4.0–10.5)

## 2020-12-28 LAB — LIPASE: Lipase: 29 U/L (ref 11.0–59.0)

## 2020-12-28 LAB — HEPATIC FUNCTION PANEL
ALT: 13 U/L (ref 0–35)
AST: 21 U/L (ref 0–37)
Albumin: 4.2 g/dL (ref 3.5–5.2)
Alkaline Phosphatase: 64 U/L (ref 39–117)
Bilirubin, Direct: 0.1 mg/dL (ref 0.0–0.3)
Total Bilirubin: 0.1 mg/dL — ABNORMAL LOW (ref 0.2–1.2)
Total Protein: 7.4 g/dL (ref 6.0–8.3)

## 2020-12-28 LAB — AMYLASE: Amylase: 40 U/L (ref 27–131)

## 2020-12-28 NOTE — Patient Instructions (Signed)

## 2020-12-28 NOTE — Progress Notes (Signed)
Subjective:  Patient ID: Stephanie Farrell, female    DOB: 07/29/1965  Age: 55 y.o. MRN: [id number]  CC: Abdominal Pain  This visit occurred during the SARS-CoV-2 public health emergency.  Safety protocols were in place, including screening questions prior to the visit, additional usage of staff PPE, and extensive cleaning of exam room while observing appropriate contact time as indicated for disinfecting solutions.    HPI Stephanie Farrell presents for f/up -  Her D translates for her. She complains of a one year hx of pain in the left anterior flank area. She describes it as an ache. She takes motrin for the pain. She refused a flu vax today.  No outpatient medications prior to visit.   No facility-administered medications prior to visit.    ROS Review of Systems  Constitutional:  Negative for diaphoresis, fatigue and unexpected weight change.  HENT: Negative.  Negative for trouble swallowing.   Eyes: Negative.   Respiratory:  Negative for cough, chest tightness, shortness of breath and wheezing.   Cardiovascular:  Negative for chest pain, palpitations and leg swelling.  Gastrointestinal:  Positive for abdominal pain. Negative for constipation, diarrhea, nausea and vomiting.  Endocrine: Negative.   Genitourinary:  Positive for flank pain. Negative for decreased urine volume, difficulty urinating, dysuria, hematuria and urgency.  Musculoskeletal:  Negative for back pain and myalgias.  Skin: Negative.   Neurological: Negative.  Negative for dizziness and weakness.  Hematological: Negative.  Negative for adenopathy. Does not bruise/bleed easily.  Psychiatric/Behavioral: Negative.     Objective:  BP 121/80 (BP Location: Left Arm)   Pulse 61   Temp 98.2 F (36.8 C) (Oral)   Wt 137 lb 3.2 oz (62.2 kg)   SpO2 97%   BMI 25.09 kg/m   BP Readings from Last 3 Encounters:  12/28/20 121/80  06/24/20 116/76  11/16/17 104/70    Wt Readings from Last 3 Encounters:  12/28/20 137 lb 3.2 oz  (62.2 kg)  06/24/20 144 lb (65.3 kg)  11/16/17 138 lb 2 oz (62.7 kg)    Physical Exam Vitals reviewed.  Constitutional:      Appearance: She is not ill-appearing.  HENT:     Nose: Nose normal.     Mouth/Throat:     Mouth: Mucous membranes are moist.  Eyes:     Conjunctiva/sclera: Conjunctivae normal.  Cardiovascular:     Rate and Rhythm: Normal rate and regular rhythm.     Heart sounds: No murmur heard. Pulmonary:     Effort: Pulmonary effort is normal.     Breath sounds: No stridor. No wheezing, rhonchi or rales.  Abdominal:     General: Abdomen is flat. Bowel sounds are normal. There is no distension.     Palpations: Abdomen is soft. There is no hepatomegaly, splenomegaly or mass.     Tenderness: There is abdominal tenderness in the left upper quadrant. There is no right CVA tenderness, left CVA tenderness or guarding.     Hernia: No hernia is present.  Musculoskeletal:        General: Normal range of motion.     Cervical back: Neck supple.     Right lower leg: No edema.     Left lower leg: No edema.  Lymphadenopathy:     Cervical: No cervical adenopathy.  Skin:    General: Skin is warm and dry.     Coloration: Skin is not pale.  Neurological:     General: No focal deficit present.     Mental  Status: She is alert.  Psychiatric:        Mood and Affect: Mood normal.        Behavior: Behavior normal.    Lab Results  Component Value Date   WBC 5.4 12/28/2020   HGB 13.3 12/28/2020   HCT 39.9 12/28/2020   PLT 223.0 12/28/2020   GLUCOSE 95 12/28/2020   CHOL 222 (H) 12/30/2020   TRIG 107.0 12/30/2020   HDL 72.60 12/30/2020   LDLDIRECT 127.0 06/24/2020   LDLCALC 128 (H) 12/30/2020   ALT 13 12/28/2020   AST 21 12/28/2020   NA 138 12/28/2020   K 4.2 12/28/2020   CL 102 12/28/2020   CREATININE 0.80 12/28/2020   BUN 14 12/28/2020   CO2 32 12/28/2020   TSH 0.68 06/24/2020   HGBA1C 5.7 06/24/2020    CT Head Wo Contrast  Result Date: 10/08/2017 CLINICAL DATA:   LEFT-sided numbness. EXAM: CT HEAD WITHOUT CONTRAST TECHNIQUE: Contiguous axial images were obtained from the base of the skull through the vertex without intravenous contrast. COMPARISON:  None. FINDINGS: BRAIN: No intraparenchymal hemorrhage, mass effect nor midline shift. Borderline parenchymal brain volume loss. No hydrocephalus. No acute large vascular territory infarcts. No abnormal extra-axial fluid collections. Basal cisterns are patent. VASCULAR: Trace calcific atherosclerosis. SKULL/SOFT TISSUES: No skull fracture. No significant soft tissue swelling. ORBITS/SINUSES: The included ocular globes and orbital contents are normal.Trace paranasal sinus mucosal thickening. Mastoid air cells are well aerated. OTHER: None. IMPRESSION: 1. No acute intracranial process. 2. Borderline parenchymal brain volume loss. Electronically Signed   By: Awilda Metro M.D.   On: 10/08/2017 00:00    Assessment & Plan:   Stephanie Farrell was seen today for abdominal pain.  Diagnoses and all orders for this visit:  Visit for screening mammogram -     MM DIGITAL SCREENING BILATERAL; Future  Left upper quadrant abdominal tenderness with rebound tenderness- Based a paucity of other symptoms, her exam , and normal labs I favor a benign cause for the pain. Will treat/diagnose further if indicated. -     CBC with Differential/Platelet; Future -     Basic metabolic panel; Future -     Lipase; Future -     Amylase; Future -     Urinalysis, Routine w reflex microscopic; Future -     Hepatic function panel; Future -     Hepatic function panel -     Urinalysis, Routine w reflex microscopic -     Amylase -     Lipase -     Basic metabolic panel -     CBC with Differential/Platelet  Pure hyperglyceridemia- Her trigs are normal now, -     Lipid panel; Future  Stephanie Farrell does not currently have medications on file.  No orders of the defined types were placed in this encounter.    Follow-up: Return in about 3 weeks  (around 01/18/2021).  Sanda Linger, MD

## 2020-12-29 ENCOUNTER — Other Ambulatory Visit: Payer: Self-pay | Admitting: Internal Medicine

## 2020-12-29 DIAGNOSIS — E781 Pure hyperglyceridemia: Secondary | ICD-10-CM | POA: Insufficient documentation

## 2020-12-30 ENCOUNTER — Other Ambulatory Visit (INDEPENDENT_AMBULATORY_CARE_PROVIDER_SITE_OTHER): Payer: 59

## 2020-12-30 DIAGNOSIS — E781 Pure hyperglyceridemia: Secondary | ICD-10-CM

## 2020-12-30 LAB — LIPID PANEL
Cholesterol: 222 mg/dL — ABNORMAL HIGH (ref 0–200)
HDL: 72.6 mg/dL (ref >39.00)
LDL Cholesterol: 128 mg/dL — ABNORMAL HIGH (ref 0–99)
NonHDL: 149.65
Total CHOL/HDL Ratio: 3
Triglycerides: 107 mg/dL (ref 0.0–149.0)
VLDL: 21.4 mg/dL (ref 0.0–40.0)

## 2020-12-30 NOTE — Telephone Encounter (Signed)
Add-on was faxed to the lab for LIPID. Not sure if can be added per add-on's 24-48 hours. If lab call back pt may have to com back for blood work.Marland KitchenRaechel Chute
